# Patient Record
Sex: Male | Born: 1945 | Race: White | Hispanic: No | Marital: Married | Smoking: Never smoker
Health system: Southern US, Community
[De-identification: ages and names within clinical notes are randomized; demographics above are authoritative.]

## PROBLEM LIST (undated history)

## (undated) DIAGNOSIS — I10 Essential (primary) hypertension: Secondary | ICD-10-CM

## (undated) DIAGNOSIS — E039 Hypothyroidism, unspecified: Secondary | ICD-10-CM

## (undated) HISTORY — PX: COLONOSCOPY: SHX174

## (undated) HISTORY — DX: Hypothyroidism, unspecified: E03.9

## (undated) HISTORY — DX: Essential (primary) hypertension: I10

## (undated) HISTORY — PX: THYROIDECTOMY: SHX17

---

## 2004-09-18 ENCOUNTER — Ambulatory Visit: Payer: Self-pay | Admitting: Professional

## 2004-09-25 ENCOUNTER — Ambulatory Visit: Payer: Self-pay | Admitting: Professional

## 2004-10-02 ENCOUNTER — Ambulatory Visit: Payer: Self-pay | Admitting: Professional

## 2004-10-09 ENCOUNTER — Ambulatory Visit: Payer: Self-pay | Admitting: Professional

## 2004-10-23 ENCOUNTER — Ambulatory Visit: Payer: Self-pay | Admitting: Professional

## 2004-11-13 ENCOUNTER — Ambulatory Visit: Payer: Self-pay | Admitting: Professional

## 2004-11-27 ENCOUNTER — Ambulatory Visit: Payer: Self-pay | Admitting: Professional

## 2009-06-15 ENCOUNTER — Ambulatory Visit: Payer: Self-pay | Admitting: Psychology

## 2010-04-25 ENCOUNTER — Emergency Department (HOSPITAL_COMMUNITY): Admission: EM | Admit: 2010-04-25 | Discharge: 2010-04-25 | Payer: Self-pay | Admitting: Emergency Medicine

## 2011-01-20 LAB — BASIC METABOLIC PANEL
Calcium: 9.6 mg/dL (ref 8.4–10.5)
GFR calc Af Amer: >60 mL/min (ref >60)
Glucose, Bld: 104 mg/dL — ABNORMAL HIGH (ref 70–99)
Potassium: 4.3 mEq/L (ref 3.5–5.1)
Sodium: 137 mEq/L (ref 135–145)

## 2011-01-20 LAB — DIFFERENTIAL
Basophils Absolute: 0 10*3/uL (ref 0.0–0.1)
Lymphs Abs: 2.1 10*3/uL (ref 0.7–4.0)
Monocytes Absolute: 0.6 10*3/uL (ref 0.1–1.0)
Monocytes Relative: 8 % (ref 3–12)

## 2011-01-20 LAB — CBC
HCT: 42.6 % (ref 39.0–52.0)
Platelets: 130 10*3/uL — ABNORMAL LOW (ref 150–400)
RDW: 12.6 % (ref 11.5–15.5)

## 2011-01-20 LAB — POCT CARDIAC MARKERS: CKMB, poc: 1.1 ng/mL (ref 1.0–8.0)

## 2013-12-06 ENCOUNTER — Encounter: Payer: Self-pay | Admitting: Gastroenterology

## 2013-12-23 ENCOUNTER — Ambulatory Visit: Payer: Self-pay | Admitting: Gastroenterology

## 2013-12-30 ENCOUNTER — Ambulatory Visit: Payer: Self-pay | Admitting: Gastroenterology

## 2014-01-13 ENCOUNTER — Ambulatory Visit (INDEPENDENT_AMBULATORY_CARE_PROVIDER_SITE_OTHER): Payer: Medicare Other | Admitting: Gastroenterology

## 2014-01-13 ENCOUNTER — Encounter (INDEPENDENT_AMBULATORY_CARE_PROVIDER_SITE_OTHER): Payer: Self-pay

## 2014-01-13 ENCOUNTER — Encounter: Payer: Self-pay | Admitting: Gastroenterology

## 2014-01-13 VITALS — BP 140/80 | HR 82 | Temp 98.2°F | Ht 71.0 in | Wt 206.0 lb

## 2014-01-13 DIAGNOSIS — R195 Other fecal abnormalities: Secondary | ICD-10-CM

## 2014-01-13 MED ORDER — PEG 3350-KCL-NA BICARB-NACL 420 G PO SOLR: 4000.0000 mL | ORAL | Status: AC

## 2014-01-13 NOTE — Progress Notes (Signed)
Primary Care Physician:  Purvis Kilts, MD Primary Gastroenterologist:  Dr. Gala Romney  Chief Complaint  Patient presents with  . Colonoscopy    HPI:   Shane Parks presents today at the request of Dr. Hilma Favors secondary to need for screening colonoscopy. Last colonoscopy about 10 years ago by Dr. Tiffany Kocher in [facility]. Notes chrnoic issues with diarrhea.  Chronic history of lactose intolerance. Lactaid helps significantly. Chronic, intermittent diarrhea. Feels like diarrhea worsening. January had 2 bad episodes. Only one day at a time. Recently will have bouts of constipation then diarrhea.  Feels like his symptoms are worsening. No hematochezia or melena. No abdominal pain. No N/V. Occasional reflux usually dietary related. Relieved by OTC agents. No dysphagia. No weight loss,lack of appetite. Taking Fibercon daily.    Past Medical History  Diagnosis Date  . Hypertension   . Hypothyroidism     Past Surgical History  Procedure Laterality Date  . Colonoscopy      10 year ago in [place]  . Thyroidectomy      Current Outpatient Prescriptions  Medication Sig Dispense Refill  . levothyroxine (SYNTHROID, LEVOTHROID) 125 MCG tablet Take 125 mcg by mouth daily before breakfast.      . losartan (COZAAR) 100 MG tablet Take 100 mg by mouth daily.        No current facility-administered medications for this visit.    Allergies as of 01/13/2014  . (No Known Allergies)    Family History  Problem Relation Age of Onset  . Colon cancer Neg Hx   . Colon polyps Mother     History   Social History  . Marital Status: Married    Spouse Name: N/A    Number of Children: N/A  . Years of Education: N/A   Occupational History  . Not on file.   Social History Main Topics  . Smoking status: Never Smoker   . Smokeless tobacco: Not on file  . Alcohol Use: Yes     Comment: rarely  . Drug Use: No  . Sexual Activity: Not on file   Other Topics Concern  . Not on  file   Social History Narrative  . No narrative on file    Review of Systems: Gen: see HPI CV: Denies chest pain, heart palpitations, peripheral edema, syncope.  Resp: +DOE GI: see HPI GU : Denies urinary burning, urinary frequency, urinary hesitancy MS: Denies joint pain, muscle weakness, cramps, or limitation of movement.  Derm: +dry skin  Psych: Denies depression, anxiety, memory loss, and confusion Heme: Denies bruising, bleeding, and enlarged lymph nodes.  Physical Exam: BP 140/80  Pulse 82  Temp(Src) 98.2 F (36.8 C) (Oral)  Ht 5\' 11"  (1.803 m)  Wt 206 lb (93.441 kg)  BMI 28.74 kg/m2 General:   Alert and oriented. Pleasant and cooperative. Well-nourished and well-developed.  Head:  Normocephalic and atraumatic. Eyes:  Without icterus, sclera clear and conjunctiva pink.  Ears:  Normal auditory acuity. Nose:  No deformity, discharge,  or lesions. Mouth:  No deformity or lesions, oral mucosa pink.  Neck:  Supple, without mass or thyromegaly. Lungs:  Clear to auscultation bilaterally. No wheezes, rales, or rhonchi. No distress.  Heart:  S1, S2 present without murmurs appreciated.  Abdomen:  +BS, soft, non-tender and non-distended. No HSM noted. No guarding or rebound. No masses appreciated.  Rectal:  Deferred  Msk:  Symmetrical without gross deformities. Normal posture. Extremities:  Without clubbing or edema. Neurologic:  Alert and  oriented x4;  grossly normal neurologically. Skin:  Intact without significant lesions or rashes. Cervical Nodes:  No significant cervical adenopathy. Psych:  Alert and cooperative. Normal mood and affect.

## 2014-01-13 NOTE — Patient Instructions (Signed)
We have scheduled you for a colonoscopy with Dr. Gala Romney in the near future.  Start taking a probiotic daily such as Align, Restora, [place] Dell, or [employer] brand.   Further recommendations to follow!

## 2014-01-14 ENCOUNTER — Encounter (HOSPITAL_COMMUNITY): Payer: Self-pay | Admitting: Pharmacy Technician

## 2014-01-16 NOTE — Assessment & Plan Note (Signed)
68 year old male with history of chronic intermittent diarrhea and reported lactose intolerance, presenting with need for routine screening colonoscopy. Last procedure about 10 years ago at outside facility. No rectal bleeding or any other concerning signs currently. Notes 2 "bad episodes" of diarrhea in January, now back to baseline. TSH drawn by PCP; results not available at time of visit. Will hold on stool studies as this appears to be a chronic issue. Add probiotic daily.   Proceed with TCS with Dr. Gala Romney in near future: the risks, benefits, and alternatives have been discussed with the patient in detail. The patient states understanding and desires to proceed.

## 2014-01-17 NOTE — Progress Notes (Signed)
Cc'd to pcp 

## 2014-01-24 ENCOUNTER — Encounter (HOSPITAL_COMMUNITY)
Admission: RE | Disposition: A | Discharge status: Home / Self Care | Payer: Self-pay | Source: Ambulatory Visit | Attending: Internal Medicine

## 2014-01-24 ENCOUNTER — Ambulatory Visit (HOSPITAL_COMMUNITY)
Admission: RE | Admit: 2014-01-24 | Discharge: 2014-01-24 | Disposition: A | Discharge status: Home / Self Care | Payer: Medicare Other | Source: Ambulatory Visit | Attending: Internal Medicine | Admitting: Internal Medicine

## 2014-01-24 ENCOUNTER — Encounter (HOSPITAL_COMMUNITY): Payer: Self-pay | Admitting: *Deleted

## 2014-01-24 DIAGNOSIS — D126 Benign neoplasm of colon, unspecified: Secondary | ICD-10-CM | POA: Insufficient documentation

## 2014-01-24 DIAGNOSIS — K5730 Diverticulosis of large intestine without perforation or abscess without bleeding: Secondary | ICD-10-CM

## 2014-01-24 DIAGNOSIS — I10 Essential (primary) hypertension: Secondary | ICD-10-CM | POA: Insufficient documentation

## 2014-01-24 DIAGNOSIS — Z1211 Encounter for screening for malignant neoplasm of colon: Secondary | ICD-10-CM

## 2014-01-24 DIAGNOSIS — Z79899 Other long term (current) drug therapy: Secondary | ICD-10-CM | POA: Insufficient documentation

## 2014-01-24 DIAGNOSIS — R195 Other fecal abnormalities: Secondary | ICD-10-CM

## 2014-01-24 HISTORY — PX: COLONOSCOPY: SHX5424

## 2014-01-24 SURGERY — COLONOSCOPY
Anesthesia: Moderate Sedation

## 2014-01-24 MED ORDER — MIDAZOLAM HCL 5 MG/5ML IJ SOLN
INTRAMUSCULAR | Status: DC | PRN
Start: 2014-01-24 — End: 2014-01-24
  Administered 2014-01-24 (×2): 2 mg via INTRAVENOUS

## 2014-01-24 MED ORDER — MEPERIDINE HCL 100 MG/ML IJ SOLN
INTRAMUSCULAR | Status: DC | PRN
  Administered 2014-01-24: 25 mg via INTRAVENOUS
  Administered 2014-01-24: 50 mg via INTRAVENOUS

## 2014-01-24 MED ORDER — ONDANSETRON HCL 4 MG/2ML IJ SOLN
INTRAMUSCULAR | Status: AC
  Filled 2014-01-24: qty 2

## 2014-01-24 MED ORDER — MEPERIDINE HCL 100 MG/ML IJ SOLN
INTRAMUSCULAR | Status: AC
  Filled 2014-01-24: qty 2

## 2014-01-24 MED ORDER — SODIUM CHLORIDE 0.9 % IV SOLN
INTRAVENOUS | Status: DC
  Administered 2014-01-24: 09:00:00 via INTRAVENOUS

## 2014-01-24 MED ORDER — SIMETHICONE 40 MG/0.6ML PO SUSP
ORAL | Status: DC | PRN
  Administered 2014-01-24: 10:00:00

## 2014-01-24 MED ORDER — ONDANSETRON HCL 4 MG/2ML IJ SOLN
INTRAMUSCULAR | Status: DC | PRN
  Administered 2014-01-24: 4 mg via INTRAVENOUS

## 2014-01-24 MED ORDER — MIDAZOLAM HCL 5 MG/5ML IJ SOLN
INTRAMUSCULAR | Status: AC
  Filled 2014-01-24: qty 10

## 2014-01-24 NOTE — Interval H&P Note (Signed)
History and Physical Interval Note:  01/24/2014 9:57 AM  Shane Parks  has presented today for surgery, with the diagnosis of LOOSE STOOLS  The various methods of treatment have been discussed with the patient and family. After consideration of risks, benefits and other options for treatment, the patient has consented to  Procedure(s) with comments: COLONOSCOPY (N/A) - 1:30-moved to [place] notified pt as a surgical intervention .  The patient's history has been reviewed, patient examined, no change in status, stable for surgery.  I have reviewed the patient's chart and labs.  Questions were answered to the patient's satisfaction.     Patient reports probiotic therapy has helped normalize bowel function. Colonoscopy today for screening purposes.  The risks, benefits, limitations, alternatives and imponderables have been reviewed with the patient. Questions have been answered. All parties are agreeable.   Manus Rudd

## 2014-01-24 NOTE — H&P (View-Only) (Signed)
Primary Care Physician:  Purvis Kilts, MD Primary Gastroenterologist:  Dr. Gala Romney  Chief Complaint  Patient presents with  . Colonoscopy    HPI:   Shane Parks presents today at the request of Dr. Hilma Favors secondary to need for screening colonoscopy. Last colonoscopy about 10 years ago by Dr. Tiffany Kocher in [facility]. Notes chrnoic issues with diarrhea.  Chronic history of lactose intolerance. Lactaid helps significantly. Chronic, intermittent diarrhea. Feels like diarrhea worsening. January had 2 bad episodes. Only one day at a time. Recently will have bouts of constipation then diarrhea.  Feels like his symptoms are worsening. No hematochezia or melena. No abdominal pain. No N/V. Occasional reflux usually dietary related. Relieved by OTC agents. No dysphagia. No weight loss,lack of appetite. Taking Fibercon daily.    Past Medical History  Diagnosis Date  . Hypertension   . Hypothyroidism     Past Surgical History  Procedure Laterality Date  . Colonoscopy      10 year ago in [place]  . Thyroidectomy      Current Outpatient Prescriptions  Medication Sig Dispense Refill  . levothyroxine (SYNTHROID, LEVOTHROID) 125 MCG tablet Take 125 mcg by mouth daily before breakfast.      . losartan (COZAAR) 100 MG tablet Take 100 mg by mouth daily.        No current facility-administered medications for this visit.    Allergies as of 01/13/2014  . (No Known Allergies)    Family History  Problem Relation Age of Onset  . Colon cancer Neg Hx   . Colon polyps Mother     History   Social History  . Marital Status: Married    Spouse Name: N/A    Number of Children: N/A  . Years of Education: N/A   Occupational History  . Not on file.   Social History Main Topics  . Smoking status: Never Smoker   . Smokeless tobacco: Not on file  . Alcohol Use: Yes     Comment: rarely  . Drug Use: No  . Sexual Activity: Not on file   Other Topics Concern  . Not on  file   Social History Narrative  . No narrative on file    Review of Systems: Gen: see HPI CV: Denies chest pain, heart palpitations, peripheral edema, syncope.  Resp: +DOE GI: see HPI GU : Denies urinary burning, urinary frequency, urinary hesitancy MS: Denies joint pain, muscle weakness, cramps, or limitation of movement.  Derm: +dry skin  Psych: Denies depression, anxiety, memory loss, and confusion Heme: Denies bruising, bleeding, and enlarged lymph nodes.  Physical Exam: BP 140/80  Pulse 82  Temp(Src) 98.2 F (36.8 C) (Oral)  Ht 5\' 11"  (1.803 m)  Wt 206 lb (93.441 kg)  BMI 28.74 kg/m2 General:   Alert and oriented. Pleasant and cooperative. Well-nourished and well-developed.  Head:  Normocephalic and atraumatic. Eyes:  Without icterus, sclera clear and conjunctiva pink.  Ears:  Normal auditory acuity. Nose:  No deformity, discharge,  or lesions. Mouth:  No deformity or lesions, oral mucosa pink.  Neck:  Supple, without mass or thyromegaly. Lungs:  Clear to auscultation bilaterally. No wheezes, rales, or rhonchi. No distress.  Heart:  S1, S2 present without murmurs appreciated.  Abdomen:  +BS, soft, non-tender and non-distended. No HSM noted. No guarding or rebound. No masses appreciated.  Rectal:  Deferred  Msk:  Symmetrical without gross deformities. Normal posture. Extremities:  Without clubbing or edema. Neurologic:  Alert and  oriented x4;  grossly normal neurologically. Skin:  Intact without significant lesions or rashes. Cervical Nodes:  No significant cervical adenopathy. Psych:  Alert and cooperative. Normal mood and affect.

## 2014-01-24 NOTE — Discharge Instructions (Signed)
°  Colonoscopy Discharge Instructions  Read the instructions outlined below and refer to this sheet in the next few weeks. These discharge instructions provide you with general information on caring for yourself after you leave the hospital. Your doctor may also give you specific instructions. While your treatment has been planned according to the most current medical practices available, unavoidable complications occasionally occur. If you have any problems or questions after discharge, call Dr. Gala Romney at [phone]. ACTIVITY  You may resume your regular activity, but move at a slower pace for the next 24 hours.   Take frequent rest periods for the next 24 hours.   Walking will help get rid of the air and reduce the bloated feeling in your belly (abdomen).   No driving for 24 hours (because of the medicine (anesthesia) used during the test).    Do not sign any important legal documents or operate any machinery for 24 hours (because of the anesthesia used during the test).  NUTRITION  Drink plenty of fluids.   You may resume your normal diet as instructed by your doctor.   Begin with a light meal and progress to your normal diet. Heavy or fried foods are harder to digest and may make you feel sick to your stomach (nauseated).   Avoid alcoholic beverages for 24 hours or as instructed.  MEDICATIONS  You may resume your normal medications unless your doctor tells you otherwise.  WHAT YOU CAN EXPECT TODAY  Some feelings of bloating in the abdomen.   Passage of more gas than usual.   Spotting of blood in your stool or on the toilet paper.  IF YOU HAD POLYPS REMOVED DURING THE COLONOSCOPY:  No aspirin products for 7 days or as instructed.   No alcohol for 7 days or as instructed.   Eat a soft diet for the next 24 hours.  FINDING OUT THE RESULTS OF YOUR TEST Not all test results are available during your visit. If your test results are not back during the visit, make an appointment  with your caregiver to find out the results. Do not assume everything is normal if you have not heard from your caregiver or the medical facility. It is important for you to follow up on all of your test results.  SEEK IMMEDIATE MEDICAL ATTENTION IF:  You have more than a spotting of blood in your stool.   Your belly is swollen (abdominal distention).   You are nauseated or vomiting.   You have a temperature over 101.   You have abdominal pain or discomfort that is severe or gets worse throughout the day.    Polyp and diverticulosis information  Further recommendations to follow pending review of pathology

## 2014-01-24 NOTE — OR Nursing (Signed)
Dr. Gala Romney notified of increased BP and of patient stating, "I have white coat syndrome." Patient took Losartan this am.No orders received. Will continue to monitor BP throughout procedure.

## 2014-01-24 NOTE — Op Note (Signed)
[facility] [address] [place], [address]   COLONOSCOPY PROCEDURE REPORT  PATIENT: Shane Parks, Shane Parks  MR#:         [id number] BIRTHDATE: 1946-09-10 , 65  yrs. old GENDER: Male ENDOSCOPIST: R.  Garfield Cornea, MD FACP FACG REFERRED BY:  Sharilyn Sites, M.D. PROCEDURE DATE:  01/24/2014 PROCEDURE:     Colonoscopy with multiple snare polypectomies  INDICATIONS: Average risk colorectal cancer screening examination  INFORMED CONSENT:  The risks, benefits, alternatives and imponderables including but not limited to bleeding, perforation as well as the possibility of a missed lesion have been reviewed.  The potential for biopsy, lesion removal, etc. have also been discussed.  Questions have been answered.  All parties agreeable. Please see the history and physical in the medical record for more information.  MEDICATIONS: Versed 4 mg and Demerol 75 mg in divided doses.  Zofran 4 mg IV  DESCRIPTION OF PROCEDURE:  After a digital rectal exam was performed, the EC-[id number] ([id number])  colonoscope was advanced from the anus through the rectum and colon to the area of the cecum, ileocecal valve and appendiceal orifice.  The cecum was deeply intubated.  These structures were well-seen and photographed for the record.  From the level of the cecum and ileocecal valve, the scope was slowly and cautiously withdrawn.  The mucosal surfaces were carefully surveyed utilizing scope tip deflection to facilitate fold flattening as needed.  The scope was pulled down into the rectum where a thorough examination including retroflexion was performed.    FINDINGS:  Adequate preparation. Normal rectum. Scattered left-sided diverticula; (3) 4 mm polyps at the splenic flexure; otherwise, the remainder of the colonic mucosa appeared normal.  THERAPEUTIC / DIAGNOSTIC MANEUVERS PERFORMED:  The above-mentioned polyps were cold snare removed and recovered for the  pathologist  COMPLICATIONS: None  CECAL WITHDRAWAL TIME:  12 minutes  IMPRESSION:  Colonic diverticulosis. Colonic polyps-removed as described above  RECOMMENDATIONS: Followup on pathology.   _______________________________ eSigned:  R. Garfield Cornea, MD FACP [facility] 01/24/2014 10:34 AM   CC:

## 2014-01-29 ENCOUNTER — Encounter: Payer: Self-pay | Admitting: Internal Medicine

## 2014-01-31 ENCOUNTER — Encounter (HOSPITAL_COMMUNITY): Payer: Self-pay | Admitting: Internal Medicine

## 2015-05-11 ENCOUNTER — Ambulatory Visit (HOSPITAL_COMMUNITY)
Admission: RE | Admit: 2015-05-11 | Discharge: 2015-05-11 | Disposition: A | Discharge status: Home / Self Care | Payer: Medicare Other | Source: Ambulatory Visit | Attending: Physician Assistant | Admitting: Physician Assistant

## 2015-05-11 ENCOUNTER — Other Ambulatory Visit (HOSPITAL_COMMUNITY): Payer: Self-pay | Admitting: Physician Assistant

## 2015-05-11 DIAGNOSIS — R0602 Shortness of breath: Secondary | ICD-10-CM

## 2015-05-11 DIAGNOSIS — R42 Dizziness and giddiness: Secondary | ICD-10-CM | POA: Insufficient documentation

## 2015-05-11 DIAGNOSIS — Z0000 Encounter for general adult medical examination without abnormal findings: Secondary | ICD-10-CM

## 2015-10-05 DEATH — deceased

## 2016-01-25 IMAGING — CR DG CHEST 2V
2 series · 2 of 2 positions shown · non-contrast
Comparison: Single view of the chest 04/25/2010.

CLINICAL DATA: Lightheadedness when exercising.  Initial encounter.

EXAM:
CHEST  2 VIEW

[view not recorded (1 of 2)]
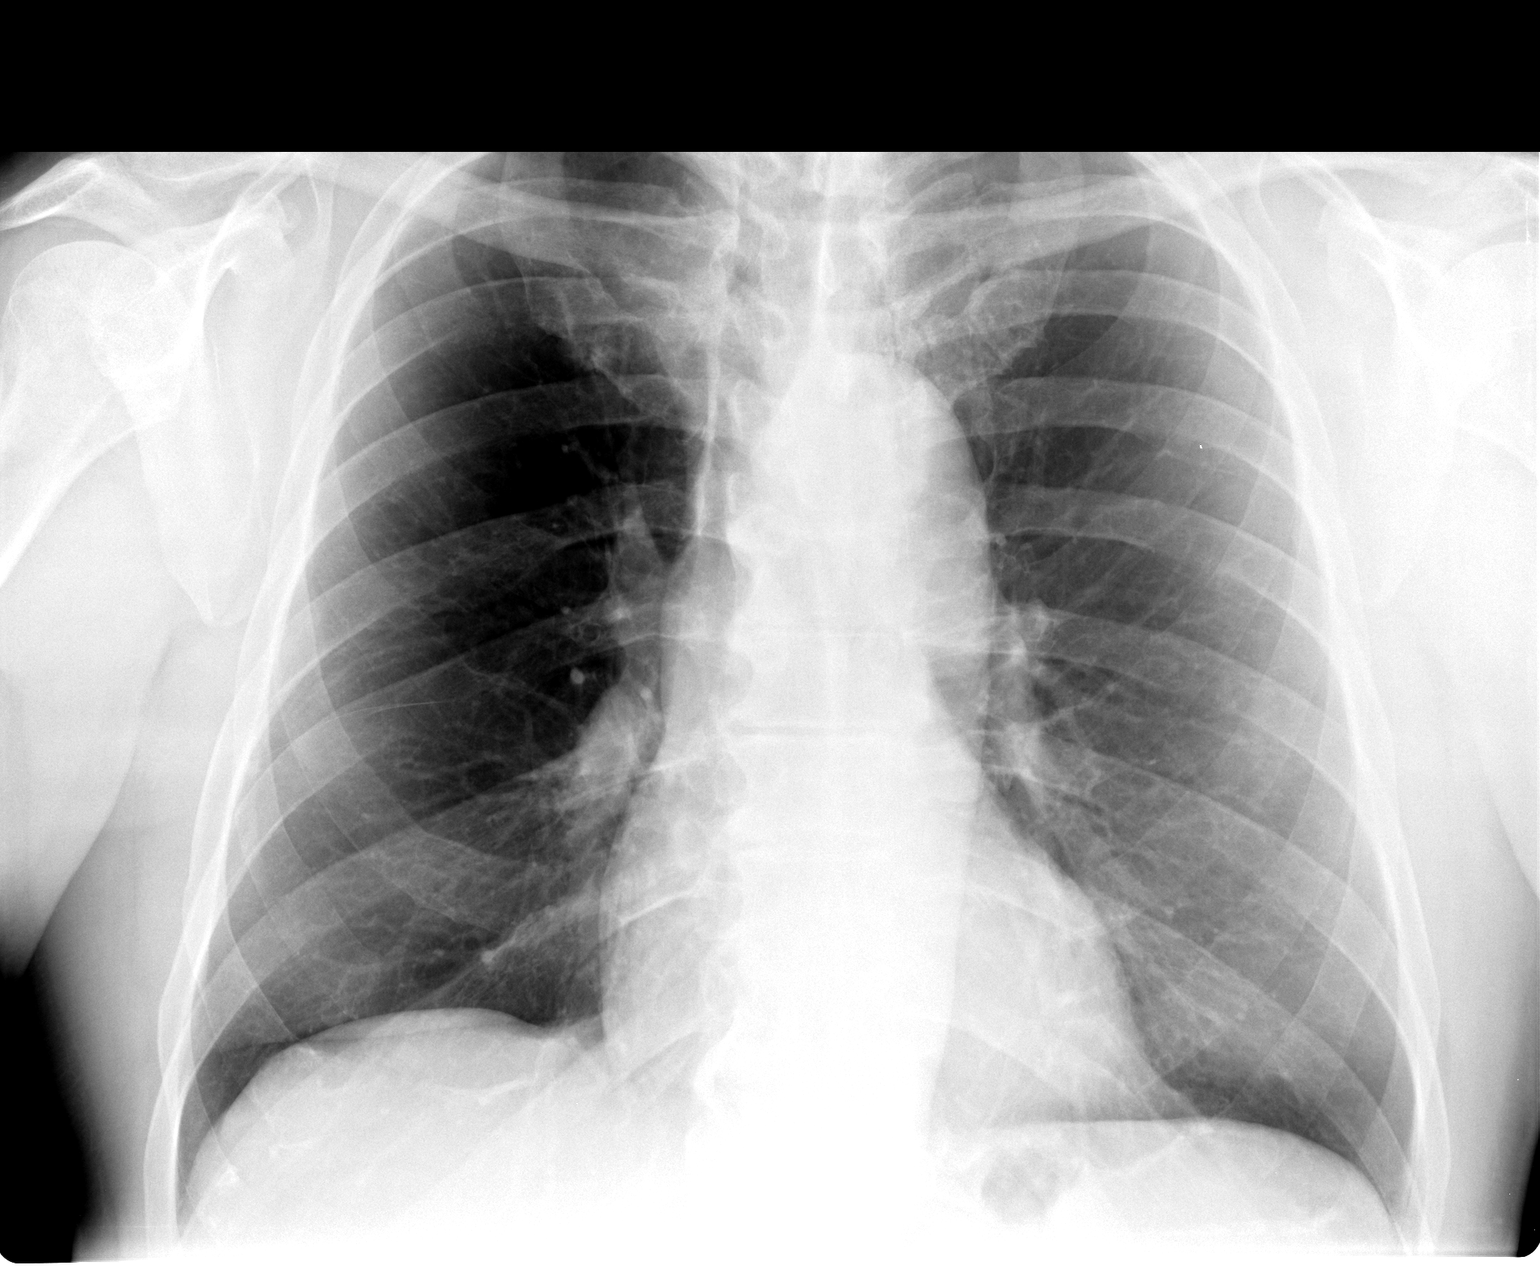

[view not recorded (2 of 2)]
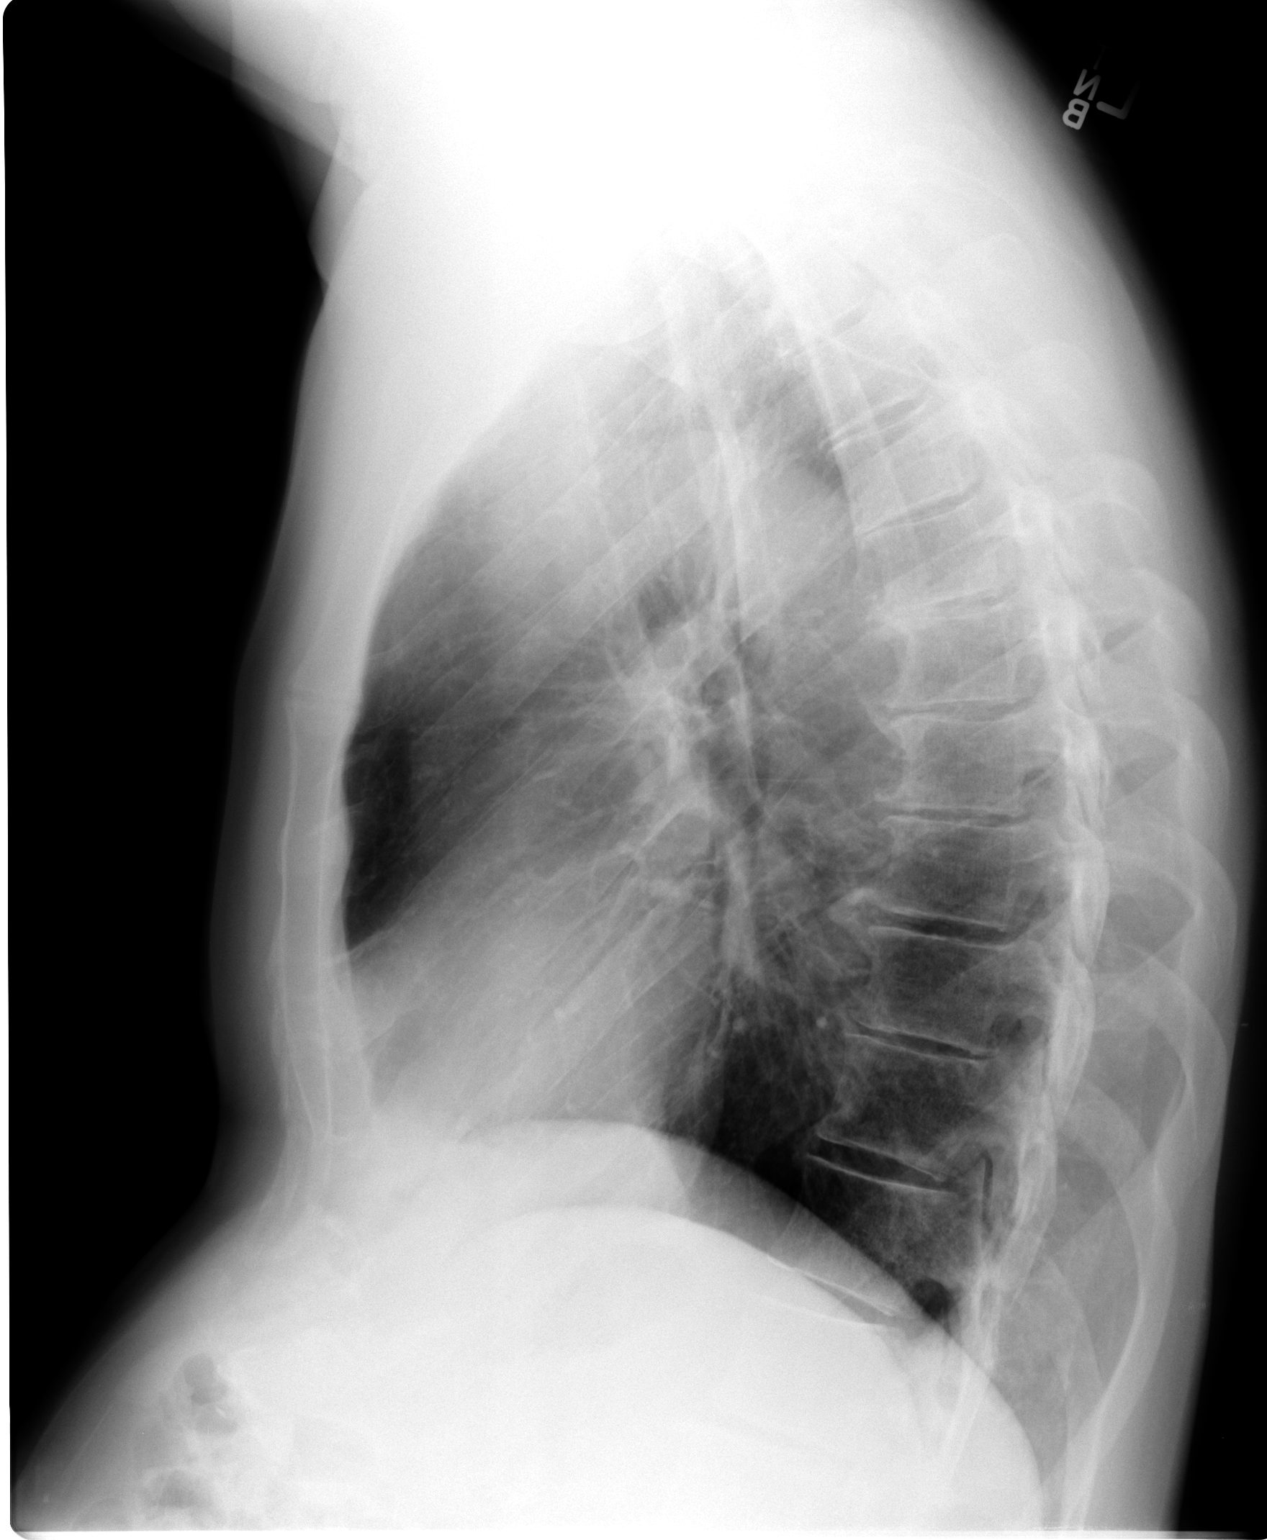

[2 of 2 positions shown; findings below may reference images not displayed]

FINDINGS: The lungs are clear. Heart size is normal. No pneumothorax or
pleural effusion. Thoracic spondylosis noted.
IMPRESSION: No acute disease.
